# Patient Record
Sex: Female | Born: 1965 | Race: White | Hispanic: No | Marital: Married | Smoking: Never smoker
Health system: Southern US, Community
[De-identification: ages and names within clinical notes are randomized; demographics above are authoritative.]

---

## 2004-07-01 ENCOUNTER — Encounter: Admission: RE | Admit: 2004-07-01 | Discharge: 2004-07-01 | Payer: Self-pay | Admitting: Family Medicine

## 2007-06-19 ENCOUNTER — Inpatient Hospital Stay (HOSPITAL_COMMUNITY): Admission: RE | Admit: 2007-06-19 | Discharge: 2007-06-22 | Payer: Self-pay | Admitting: Neurosurgery

## 2009-02-11 ENCOUNTER — Emergency Department (HOSPITAL_BASED_OUTPATIENT_CLINIC_OR_DEPARTMENT_OTHER): Admission: EM | Admit: 2009-02-11 | Discharge: 2009-02-11 | Payer: Self-pay | Admitting: Emergency Medicine

## 2009-02-11 ENCOUNTER — Ambulatory Visit: Payer: Self-pay | Admitting: Diagnostic Radiology

## 2009-09-29 IMAGING — CR DG ABDOMEN 2V
2 series · 2 of 2 positions shown · non-contrast
Comparison: Acute abdominal series 07/01/2004

CLINICAL DATA: Abdominal pain

ABDOMEN - 2 VIEW

[w abdomen upright]
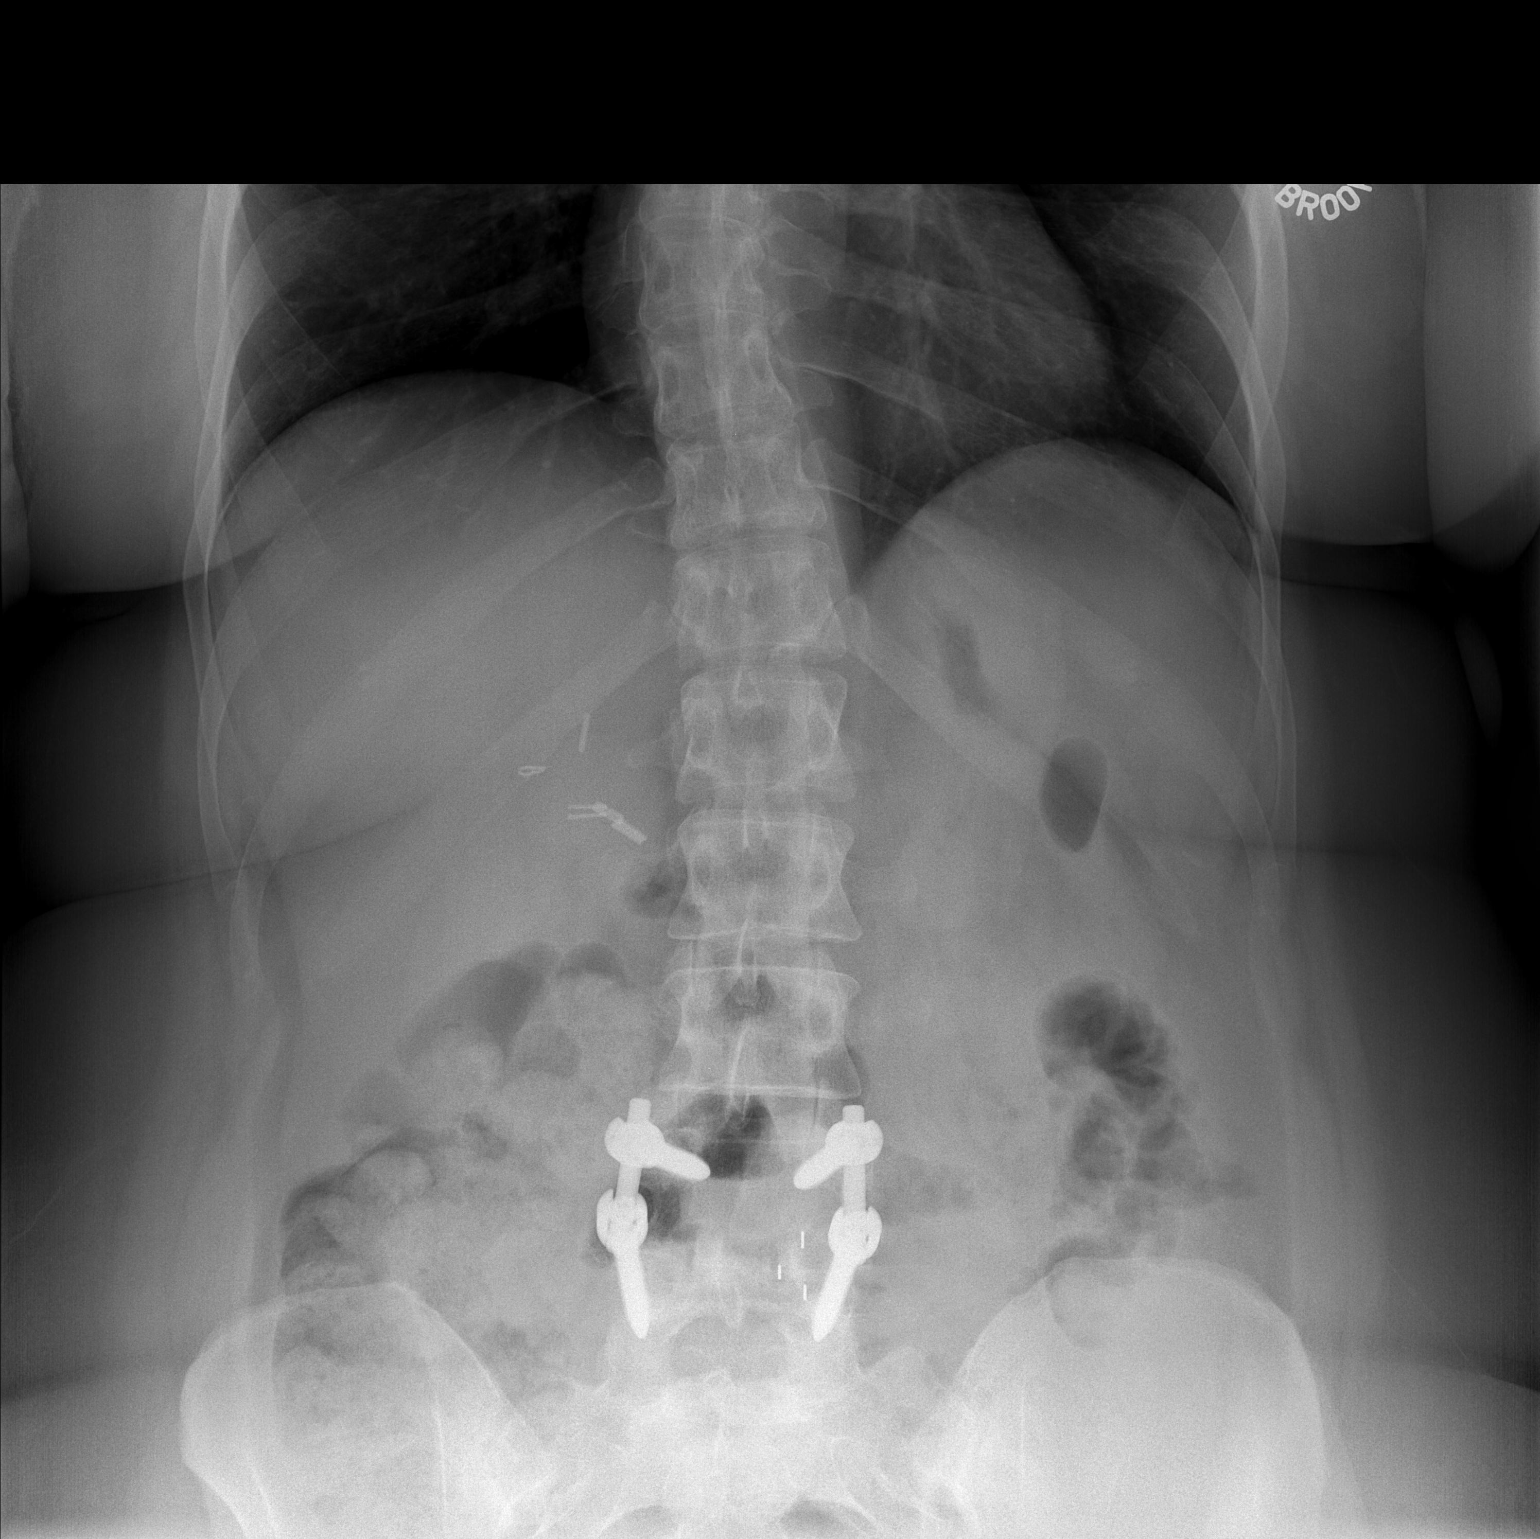

[t abdomen supine]
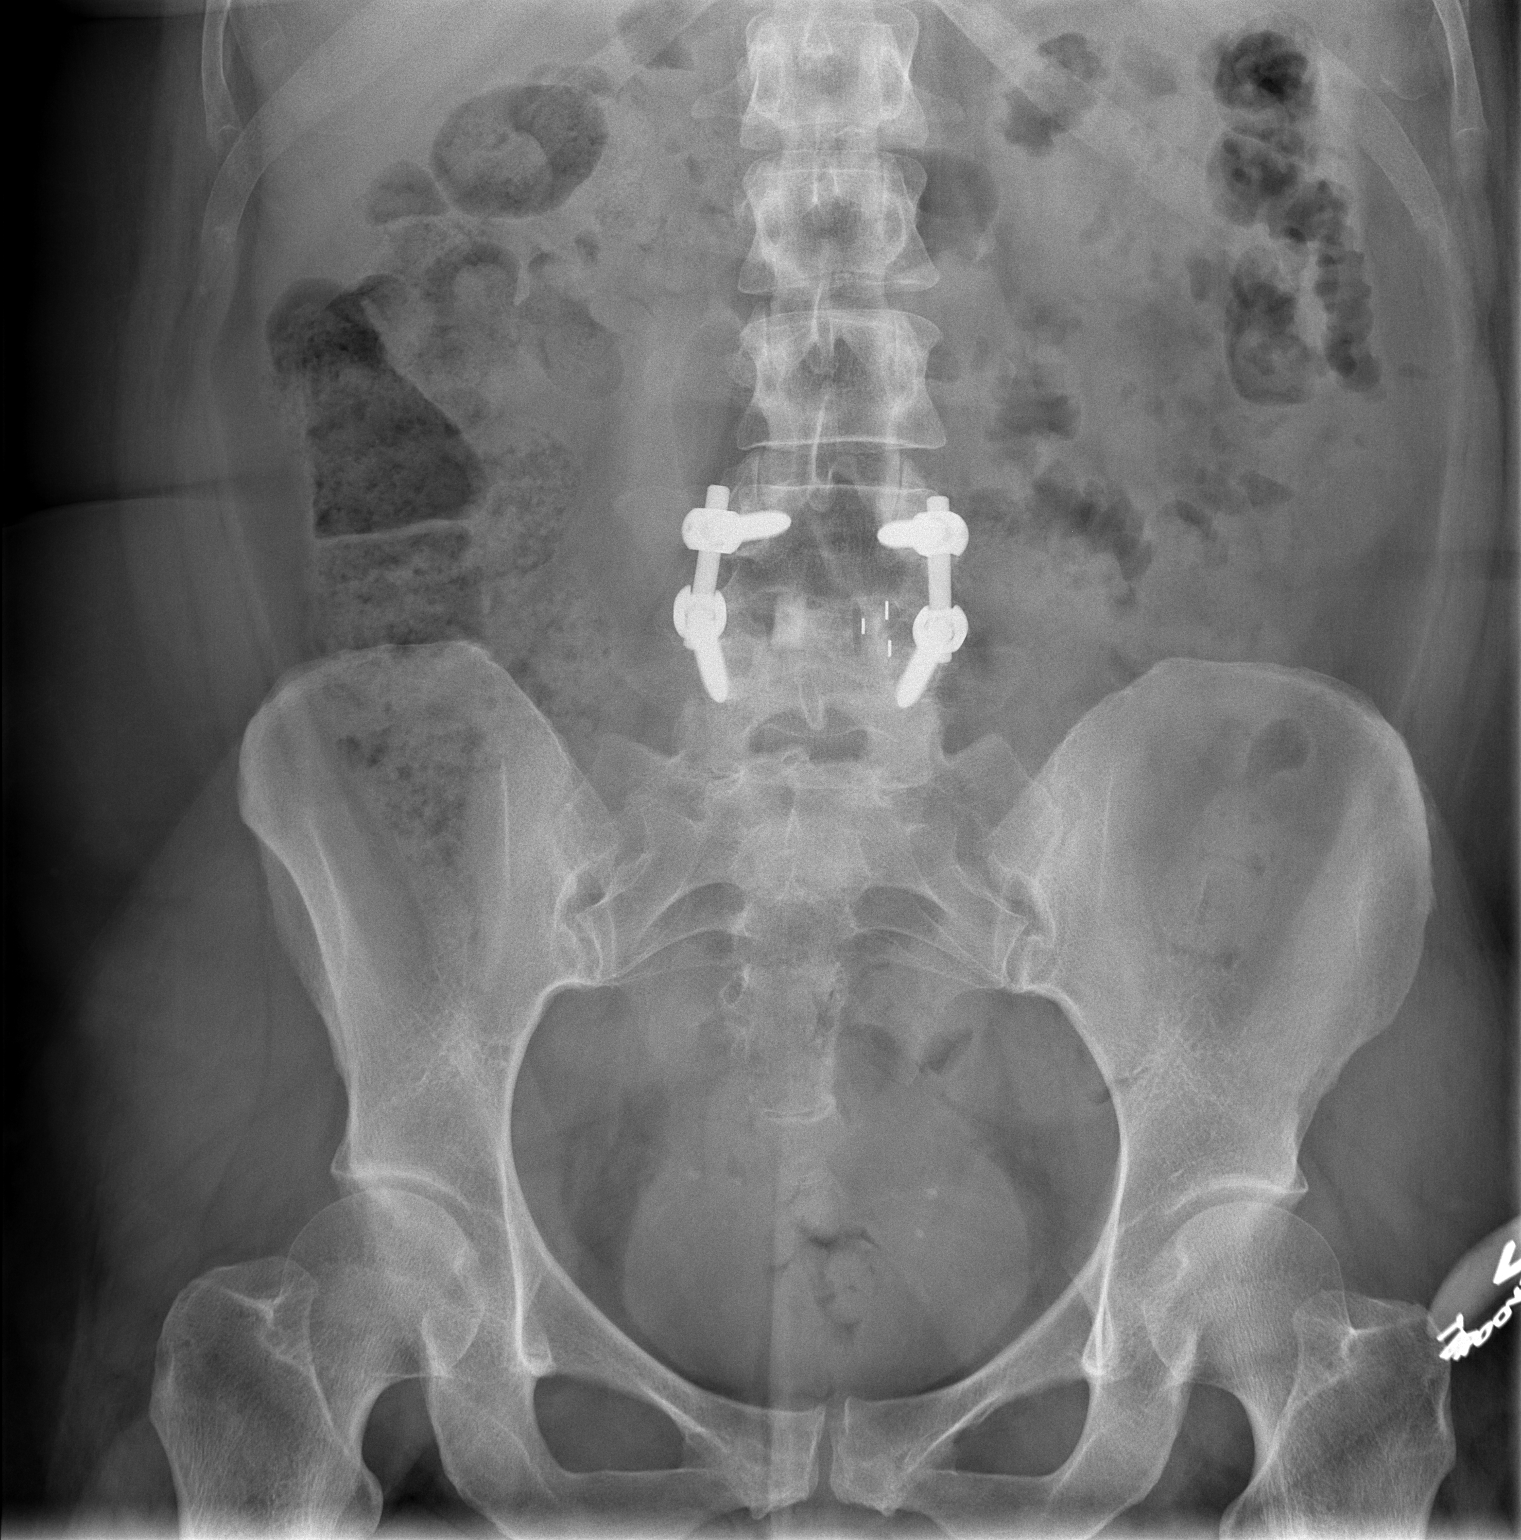

[2 of 2 positions shown; findings below may reference images not displayed]

FINDINGS: There are postsurgical changes in the right upper quadrant
suggestive of prior cholecystectomy.  Additionally, there are
changes of prior laminectomy and interbody fusion at L4-5.

The bowel gas pattern is nonobstructive.  Moderate stool is seen in
the proximal colon.  No significant stool burden distally in the
colon.  A few pelvic calcifications appear stable compared to the
prior examination and are likely phleboliths.  No radiopaque
urinary tract calculus is identified.

The visualized lung bases are clear.  There is mild convex right
scoliosis of the mid to lower thoracic spine.  No acute osseous
abnormality is identified.
IMPRESSION: 1.  Nonobstructive bowel gas pattern.
2.  Postsurgical changes at L4-5.

## 2010-12-20 ENCOUNTER — Encounter: Payer: Self-pay | Admitting: Neurosurgery

## 2011-03-11 LAB — DIFFERENTIAL
Lymphocytes Relative: 16 % (ref 12–46)
Lymphs Abs: 1.4 10*3/uL (ref 0.7–4.0)
Monocytes Relative: 6 % (ref 3–12)

## 2011-03-11 LAB — COMPREHENSIVE METABOLIC PANEL
ALT: 19 U/L (ref 0–35)
Alkaline Phosphatase: 142 U/L — ABNORMAL HIGH (ref 39–117)
BUN: 12 mg/dL (ref 6–23)
CO2: 27 mEq/L (ref 19–32)
Chloride: 104 mEq/L (ref 96–112)
Creatinine, Ser: 0.8 mg/dL (ref 0.4–1.2)
GFR calc Af Amer: >60 mL/min (ref >60)
GFR calc non Af Amer: >60 mL/min (ref >60)
Potassium: 3.9 mEq/L (ref 3.5–5.1)
Sodium: 140 mEq/L (ref 135–145)
Total Protein: 7.6 g/dL (ref 6.0–8.3)

## 2011-03-11 LAB — CBC
MCHC: 33.8 g/dL (ref 30.0–36.0)
RBC: 4.15 MIL/uL (ref 3.87–5.11)
RDW: 13.9 % (ref 11.5–15.5)

## 2011-04-13 NOTE — Op Note (Signed)
Shelly Dalton, Shelly NO.:  [id number]   MEDICAL RECORD NO.:  [id number]          PATIENT TYPE:  INP   LOCATION:  2899                         FACILITY:  [facility]   PHYSICIAN:  Kathaleen Maser. Pool, M.D.    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   PROCEDURE:  L4-5 decompressive laminectomies with foraminotomies, more  than would be required for simple interbody fusion alone.  L4-5  posterior lumbar interbody fusion utilizing tangent interbody allograft  wedge, Telamon interbody PEEK cage and local autografting.  L4-5  posterolateral arthrodesis utilizing nonsegmental pedicle screw fixation  and local autograft.   SURGEON:  Kathaleen Maser. Pool, M.D.   ASSISTANT:  Reinaldo Meeker, M.D.   ANESTHESIA:  General orotracheal.   INDICATIONS FOR PROCEDURE:  The patient is 45 year old female history of  severe back pain with bilateral lower extremity symptoms failing all  conservative care.  Workup demonstrates evidence of progressive disk  degeneration at L4-5 with resultant central disk herniation and  stenosis.  The patient has foraminal stenosis bilaterally as well.  She  presents now for L4-5 decompression and fusion in hopes improving her  symptoms.   OPERATION:  The patient taken to the operating room and placed on  operative table in supine position, and adequate anesthesia was  achieved. The patient was positioned prone onto Wilson frame and  appropriately padded. The patient's lumbar region was prepped and draped  in sterile fashion.  10 blade was used to make skin incision overlying  the L4-5 interspace.  This was carried down sharply and resection then  performed exposing the lamina  facet joints of L3, L4 and L5 as well as  transverse processes of L4 and L5.  Deep self retaining retractor was  placed and intraoperative fluoroscopy was used and levels were  confirmed.  Decompressive laminectomies then performed using Leksell  rongeurs, Kerrison rongeurs, high-speed drill to remove the entire  lamina of L4, entire facet joint of L4, and the entire superior facet of  L5 bilaterally.  Superior laminectomy of L5 was also performed.  All  bone was cleaned using later autograft.  Ligament flavum was elevated  and resected fashion using Kerrison rongeurs.  Wide decompressive  foraminotomies were then performed along course exiting L4-L5 nerve  roots bilaterally.  Epidural venous plexus coagulated and cut.  Thecal  sac was gently mobilized and retracted towards the midline __________  the right side.  Disk space incised 15 blade in a rectangular fashion.  Wide disk space clean-out was achieved using pituitary rongeurs, forward  angled pituitary rongeurs and Epstein curettes.  The procedure then  repeated on the contralateral side.  Disk space then sequentially  dilated to 10 mm with 10 mm distractor on the patient's right side.  Thecal sac and nerve root was inspected on the left side.  Disk space  then  reamed and then cut with an 10 mL tangent instruments.  Soft  tissues removed using pituitary rongeurs.  A 10 x 26 mm Telamon cage  packed with morselized autograft mixed with Progenics bone putty was  then packed into place and recessed roughly 2 mm posterior cortical  margin.  Distractors removed from the patient's right side.  Thecal sac  and nerve roots inspected on the right side.  Disk space then reamed and  cut with 10 mm instruments once again.  Disk space was further  curettaged.  All soft tissues removed from interspace.  Morselized  autograft mixed with Progenics putty was then packed in interspace.  A  10 x 26 mm tangent wedge then packed into place and recessed  roughly 2  mm  from the posterior margin of L4.  Pedicles of L4 and L5 identified  using superficial landmarks and intraoperative fluoroscopy.  Superficial  bone around pedicle was then removed using high-speed drill.  The  patient was then probed using pedicle awl.  Pedicle awl track was then  tapped 5.25 inch screw tap.  Screw tap hole was probed and found to be  solid of bone.  6.75 x 45 mm radius screws placed bilaterally at L4,  6.75 x 40 mm screws placed bilaterally at L5.  Transverse processes L4-  L5 were then decorticated using high speed drill.  Morselized autograft  and Progenics putty was then packed posterolaterally for later fusion.  Short segment titanium rod was then contoured and placed over screw  heads at L4-L5.  Locking caps placed over screw heads and interlocking  caps were then engaged with construct under compression.  Final images  revealed good position of bone grafts, proper alignment of spine.  Wound  was then irrigated with antibiotic solution.  Gelfoam was placed  topically and hemostasis found to be good.  Medium Hemovac drain was  left in vertebral pace.  Wound was then closed in layers with Vicryl  suture.  Steri-Strips and sterile dressings were applied.  There were no  complications.  The patient tolerated the procedure well and she returns  to recovery room postoperatively.           ______________________________  Kathaleen Maser Pool, M.D.     HAP/MEDQ  D:  06/19/2007  T:  06/19/2007  Job:  [id number]

## 2011-04-16 NOTE — Discharge Summary (Signed)
NAMEVERNEE, [name]:  [id number]   MEDICAL RECORD NO.:  [id number]          PATIENT TYPE:  INP   LOCATION:  3007                         FACILITY:  [facility]   PHYSICIAN:  Kathaleen Maser. Pool, M.D.    DATE OF BIRTH:  Jul 12, 1966   DATE OF ADMISSION:  06/19/2007  DATE OF DISCHARGE:  06/22/2007                               DISCHARGE SUMMARY   FINAL DIAGNOSIS:  L4-5 degenerative disease with herniated nucleus  pulposus and stenosis.   HISTORY OF PRESENT ILLNESS:  Ms. Shelly Dalton is a 45 year old female with  history of back pain and lower extremity function failing all  conservative __________ demonstrates evidence of significant disk  degeneration with associated disk herniation and stenosis at L4-5.  The  patient presents now for decompression and fusion with instrumentation  for hope of improving symptoms.   HOSPITAL COURSE:  The patient taken to the operating room where an  uncomplicated L4-5 decompression and fusion and instrumentation was  performed.  Postoperatively, the patient's back and lower extremity pain  were improved.  Strength and sensation are intact.  She was gradually  mobilized with the aid of physical and occupational therapy.  On her  third postoperative day her pain was well-controlled and she is ready  for discharge home.  Condition at discharge is improved.   DISCHARGE DISPOSITION:  The patient to be discharged to home.  She will  follow-up in my office in 1 week.           ______________________________  Kathaleen Maser. Pool, M.D.     HAP/MEDQ  D:  08/22/2007  T:  08/23/2007  Job:  [id number]

## 2011-09-13 LAB — CBC
HCT: 36.3
MCV: 86.7
RBC: 4.19
WBC: 7.5

## 2011-09-13 LAB — DIFFERENTIAL
Basophils Relative: 1
Eosinophils Relative: 7 — ABNORMAL HIGH
Neutrophils Relative %: 56

## 2011-09-13 LAB — BASIC METABOLIC PANEL
BUN: 10
Calcium: 9.2
Chloride: 108
Potassium: 4.5

## 2011-09-13 LAB — HCG, SERUM, QUALITATIVE: Preg, Serum: NEGATIVE

## 2011-09-13 LAB — TYPE AND SCREEN: ABO/RH(D): A POS

## 2015-04-17 ENCOUNTER — Ambulatory Visit (INDEPENDENT_AMBULATORY_CARE_PROVIDER_SITE_OTHER): Payer: BC Managed Care – PPO

## 2015-04-17 ENCOUNTER — Ambulatory Visit (INDEPENDENT_AMBULATORY_CARE_PROVIDER_SITE_OTHER): Payer: BC Managed Care – PPO | Admitting: Podiatry

## 2015-04-17 ENCOUNTER — Encounter: Payer: Self-pay | Admitting: Podiatry

## 2015-04-17 VITALS — BP 148/92 | HR 59 | Resp 12

## 2015-04-17 DIAGNOSIS — M722 Plantar fascial fibromatosis: Secondary | ICD-10-CM

## 2015-04-17 MED ORDER — MELOXICAM 15 MG PO TABS: 15.0000 mg | ORAL_TABLET | Freq: Every day | ORAL | Status: DC

## 2015-04-17 MED ORDER — TRIAMCINOLONE ACETONIDE 10 MG/ML IJ SUSP
10.0000 mg | Freq: Once | INTRAMUSCULAR | Status: AC
  Administered 2015-04-17: 10 mg

## 2015-04-17 NOTE — Progress Notes (Signed)
   Subjective:    Patient ID: Shelly Dalton, female    DOB: Dec 10, 1965, 49 y.o.   MRN: [id number]  HPI  PT STATED RT BOTTOM OF THE HEEL HAVING SHARP PAIN, ACHING AND BACK OF THE CALF HAVE TIGHTNESS FEELING FOR 2 WEEKS. THE HEEL IS GETTING WORSE ESPECIALLY WHEN SITTING FOR A WHILE THEN GET UP IS PAINFUL BUT START WALKING GETS BETTER. TRIED NO TREATMENT.  Review of Systems  Musculoskeletal: Positive for gait problem.       Objective:   Physical Exam        Assessment & Plan:

## 2015-04-17 NOTE — Patient Instructions (Signed)

## 2015-04-18 NOTE — Progress Notes (Signed)
Subjective:     Patient ID: Shelly Dalton, female   DOB: [birth year], 49 y.o.   MRN: [id number]  HPI patient presents stating I'm getting a lot of pain in the bottom of my right heel at the insertion of the tendon into the calcaneus and it's been going on for a little while and has worsened over the last several weeks. I've tried to just reduce activity without relief   Review of Systems  All other systems reviewed and are negative.      Objective:   Physical Exam  Constitutional: She is oriented to person, place, and time.  Cardiovascular: Intact distal pulses.   Musculoskeletal: Normal range of motion.  Neurological: She is oriented to person, place, and time.  Skin: Skin is warm.  Nursing note and vitals reviewed.  neurovascular status intact muscle strength adequate with range of motion subtalar midtarsal joint within normal limits. Patient's noted to have moderate depression of the arch has exquisite discomfort on the right heel at the insertional point of the tendon into the calcaneus with fluid buildup noted     Assessment:     Plantar fasciitis right with inflammation and fluid buildup noted    Plan:     H&P and x-rays reviewed and today I injected the right plantar fascia 3 mg Kenalog 5 mg Xylocaine and applied fascial brace with instructions. Gave instructions on physical therapy and reappoint to recheck

## 2015-04-24 ENCOUNTER — Encounter: Payer: Self-pay | Admitting: Podiatry

## 2015-04-24 ENCOUNTER — Ambulatory Visit (INDEPENDENT_AMBULATORY_CARE_PROVIDER_SITE_OTHER): Payer: BC Managed Care – PPO | Admitting: Podiatry

## 2015-04-24 VITALS — BP 155/96 | HR 60 | Resp 12

## 2015-04-24 DIAGNOSIS — M722 Plantar fascial fibromatosis: Secondary | ICD-10-CM

## 2015-04-24 NOTE — Progress Notes (Signed)
Subjective:     Patient ID: Shelly Dalton, female   DOB: 04-11-1966, 49 y.o.   MRN: [id number]  HPI patient states my heel is feeling some better but is still sore when I pressed deep into the tissue. Also noted to have depression of the arch   Review of Systems     Objective:   Physical Exam Neurovascular status intact muscle strength adequate with discomfort in the plantar fascial left that's moderate in intensity with mechanical dysfunction of the foot structure    Assessment:     Planter fasciitis right still present but improved along with mechanical foot disorder    Plan:     H&P and condition reviewed with patient. I have recommended long-term orthotics and discussed custom orthotic devices which patient wants made. I also discussed physical therapy stretching exercises and supportive shoe gear usage

## 2015-05-16 ENCOUNTER — Ambulatory Visit: Payer: BC Managed Care – PPO | Admitting: *Deleted

## 2015-05-16 DIAGNOSIS — M722 Plantar fascial fibromatosis: Secondary | ICD-10-CM

## 2015-05-16 NOTE — Progress Notes (Signed)
Pt is here to [facility]O 

## 2015-05-16 NOTE — Patient Instructions (Signed)

## 2015-10-11 ENCOUNTER — Other Ambulatory Visit: Payer: Self-pay | Admitting: Podiatry

## 2015-10-13 NOTE — Telephone Encounter (Signed)
Pt needs an appt prior to refills if continuing to have problems or if a new problem exist.

## 2018-01-11 ENCOUNTER — Encounter (HOSPITAL_COMMUNITY): Payer: Self-pay | Admitting: *Deleted
